# Patient Record
Sex: Female | Born: 1945 | Race: Asian | Hispanic: No | Marital: Single | Smoking: Former smoker
Health system: Southern US, Community
[De-identification: ages and names within clinical notes are randomized; demographics above are authoritative.]

## PROBLEM LIST (undated history)

## (undated) DIAGNOSIS — I10 Essential (primary) hypertension: Secondary | ICD-10-CM

## (undated) DIAGNOSIS — M1990 Unspecified osteoarthritis, unspecified site: Secondary | ICD-10-CM

## (undated) HISTORY — PX: NO PAST SURGERIES: SHX2092

---

## 2016-01-23 DIAGNOSIS — R3129 Other microscopic hematuria: Secondary | ICD-10-CM | POA: Insufficient documentation

## 2016-01-23 DIAGNOSIS — I10 Essential (primary) hypertension: Secondary | ICD-10-CM | POA: Insufficient documentation

## 2016-01-26 ENCOUNTER — Other Ambulatory Visit: Payer: Self-pay | Admitting: Family Medicine

## 2016-01-26 DIAGNOSIS — Z1231 Encounter for screening mammogram for malignant neoplasm of breast: Secondary | ICD-10-CM

## 2016-02-09 ENCOUNTER — Ambulatory Visit
Admission: RE | Admit: 2016-02-09 | Discharge: 2016-02-09 | Disposition: A | Discharge status: Home / Self Care | Payer: Medicare Other | Source: Ambulatory Visit | Attending: Family Medicine | Admitting: Family Medicine

## 2016-02-09 ENCOUNTER — Encounter: Payer: Self-pay | Admitting: Radiology

## 2016-02-09 DIAGNOSIS — Z1231 Encounter for screening mammogram for malignant neoplasm of breast: Secondary | ICD-10-CM | POA: Insufficient documentation

## 2016-02-12 ENCOUNTER — Inpatient Hospital Stay
Admission: RE | Admit: 2016-02-12 | Discharge: 2016-02-12 | Disposition: A | Discharge status: Home / Self Care | Payer: Self-pay | Source: Ambulatory Visit | Attending: *Deleted | Admitting: *Deleted

## 2016-02-12 ENCOUNTER — Other Ambulatory Visit: Payer: Self-pay | Admitting: *Deleted

## 2016-02-12 DIAGNOSIS — Z9289 Personal history of other medical treatment: Secondary | ICD-10-CM

## 2016-12-17 ENCOUNTER — Other Ambulatory Visit: Payer: Self-pay

## 2016-12-17 DIAGNOSIS — Z1211 Encounter for screening for malignant neoplasm of colon: Secondary | ICD-10-CM

## 2016-12-17 NOTE — Progress Notes (Signed)
Gastroenterology Pre-Procedure Review  Request Date: 1/7 Requesting Physician: Dr. Servando SnareWohl  PATIENT REVIEW QUESTIONS: The patient responded to the following health history questions as indicated:    1. Are you having any GI issues? no 2. Do you have a personal history of Polyps? no 3. Do you have a family history of Colon Cancer or Polyps? no 4. Diabetes Mellitus? no 5. Joint replacements in the past 12 months?no 6. Major health problems in the past 3 months?no 7. Any artificial heart valves, MVP, or defibrillator?no    MEDICATIONS & ALLERGIES:    Patient reports the following regarding taking any anticoagulation/antiplatelet therapy:   Plavix, Coumadin, Eliquis, Xarelto, Lovenox, Pradaxa, Brilinta, or Effient? no Aspirin? no  Patient confirms/reports the following medications:  Current Outpatient Medications  Medication Sig Dispense Refill  . lisinopril (PRINIVIL,ZESTRIL) 10 MG tablet Take by mouth.    Marland Kitchen. aspirin EC 81 MG tablet Take by mouth.     No current facility-administered medications for this visit.     Patient confirms/reports the following allergies:  Allergies not on file  No orders of the defined types were placed in this encounter.   AUTHORIZATION INFORMATION Primary Insurance: 1D#: Group #:  Secondary Insurance: 1D#: Group #:  SCHEDULE INFORMATION: Date: 1/7 Time: Location: [facility]

## 2016-12-30 ENCOUNTER — Encounter: Payer: Self-pay | Admitting: *Deleted

## 2016-12-30 ENCOUNTER — Other Ambulatory Visit: Payer: Self-pay

## 2017-01-07 NOTE — Discharge Instructions (Signed)
General Anesthesia, Adult, Care After °These instructions provide you with information about caring for yourself after your procedure. Your health care provider may also give you more specific instructions. Your treatment has been planned according to current medical practices, but problems sometimes occur. Call your health care provider if you have any problems or questions after your procedure. °What can I expect after the procedure? °After the procedure, it is common to have: °· Vomiting. °· A sore throat. °· Mental slowness. ° °It is common to feel: °· Nauseous. °· Cold or shivery. °· Sleepy. °· Tired. °· Sore or achy, even in parts of your body where you did not have surgery. ° °Follow these instructions at home: °For at least 24 hours after the procedure: °· Do not: °? Participate in activities where you could fall or become injured. °? Drive. °? Use heavy machinery. °? Drink alcohol. °? Take sleeping pills or medicines that cause drowsiness. °? Make important decisions or sign legal documents. °? Take care of children on your own. °· Rest. °Eating and drinking °· If you vomit, drink water, juice, or soup when you can drink without vomiting. °· Drink enough fluid to keep your urine clear or pale yellow. °· Make sure you have little or no nausea before eating solid foods. °· Follow the diet recommended by your health care provider. °General instructions °· Have a responsible adult stay with you until you are awake and alert. °· Return to your normal activities as told by your health care provider. Ask your health care provider what activities are safe for you. °· Take over-the-counter and prescription medicines only as told by your health care provider. °· If you smoke, do not smoke without supervision. °· Keep all follow-up visits as told by your health care provider. This is important. °Contact a health care provider if: °· You continue to have nausea or vomiting at home, and medicines are not helpful. °· You  cannot drink fluids or start eating again. °· You cannot urinate after 8-12 hours. °· You develop a skin rash. °· You have fever. °· You have increasing redness at the site of your procedure. °Get help right away if: °· You have difficulty breathing. °· You have chest pain. °· You have unexpected bleeding. °· You feel that you are having a life-threatening or urgent problem. °This information is not intended to replace advice given to you by your health care provider. Make sure you discuss any questions you have with your health care provider. °Document Released: 03/29/2000 Document Revised: 05/26/2015 Document Reviewed: 12/05/2014 °Elsevier Interactive Patient Education © 2018 [employer] ° °

## 2017-01-10 ENCOUNTER — Ambulatory Visit: Payer: Medicare Other | Admitting: Anesthesiology

## 2017-01-10 ENCOUNTER — Encounter
Admission: RE | Disposition: A | Discharge status: Home / Self Care | Payer: Self-pay | Source: Ambulatory Visit | Attending: Gastroenterology

## 2017-01-10 ENCOUNTER — Ambulatory Visit
Admission: RE | Admit: 2017-01-10 | Discharge: 2017-01-10 | Disposition: A | Discharge status: Home / Self Care | Payer: Medicare Other | Source: Ambulatory Visit | Attending: Gastroenterology | Admitting: Gastroenterology

## 2017-01-10 DIAGNOSIS — Z1211 Encounter for screening for malignant neoplasm of colon: Secondary | ICD-10-CM | POA: Diagnosis present

## 2017-01-10 DIAGNOSIS — M19049 Primary osteoarthritis, unspecified hand: Secondary | ICD-10-CM | POA: Insufficient documentation

## 2017-01-10 DIAGNOSIS — Z79899 Other long term (current) drug therapy: Secondary | ICD-10-CM | POA: Diagnosis not present

## 2017-01-10 DIAGNOSIS — Z7982 Long term (current) use of aspirin: Secondary | ICD-10-CM | POA: Insufficient documentation

## 2017-01-10 DIAGNOSIS — I10 Essential (primary) hypertension: Secondary | ICD-10-CM | POA: Diagnosis not present

## 2017-01-10 DIAGNOSIS — Z87891 Personal history of nicotine dependence: Secondary | ICD-10-CM | POA: Diagnosis not present

## 2017-01-10 HISTORY — PX: COLONOSCOPY WITH PROPOFOL: SHX5780

## 2017-01-10 HISTORY — DX: Unspecified osteoarthritis, unspecified site: M19.90

## 2017-01-10 HISTORY — DX: Essential (primary) hypertension: I10

## 2017-01-10 SURGERY — COLONOSCOPY WITH PROPOFOL
Anesthesia: General | Wound class: Contaminated

## 2017-01-10 MED ORDER — LACTATED RINGERS IV SOLN
INTRAVENOUS | Status: DC
  Administered 2017-01-10: 08:00:00 via INTRAVENOUS

## 2017-01-10 MED ORDER — STERILE WATER FOR IRRIGATION IR SOLN
Status: DC | PRN
  Administered 2017-01-10: 09:00:00

## 2017-01-10 MED ORDER — ACETAMINOPHEN 325 MG PO TABS: 325.0000 mg | ORAL_TABLET | Freq: Once | ORAL | Status: DC

## 2017-01-10 MED ORDER — ACETAMINOPHEN 160 MG/5ML PO SOLN: 325.0000 mg | Freq: Once | ORAL | Status: DC

## 2017-01-10 MED ORDER — LIDOCAINE HCL (CARDIAC) 20 MG/ML IV SOLN
INTRAVENOUS | Status: DC | PRN
  Administered 2017-01-10: 30 mg via INTRAVENOUS

## 2017-01-10 MED ORDER — SODIUM CHLORIDE 0.9 % IV SOLN: INTRAVENOUS | Status: DC

## 2017-01-10 MED ORDER — PROPOFOL 10 MG/ML IV BOLUS
INTRAVENOUS | Status: DC | PRN
  Administered 2017-01-10: 20 mg via INTRAVENOUS
  Administered 2017-01-10: 30 mg via INTRAVENOUS
  Administered 2017-01-10: 20 mg via INTRAVENOUS
  Administered 2017-01-10: 100 mg via INTRAVENOUS
  Administered 2017-01-10: 20 mg via INTRAVENOUS
  Administered 2017-01-10: 30 mg via INTRAVENOUS

## 2017-01-10 SURGICAL SUPPLY — 23 items

## 2017-01-10 NOTE — Transfer of Care (Signed)
Immediate Anesthesia Transfer of Care Note  Patient: Helen Jimenez  Procedure(s) Performed: COLONOSCOPY WITH PROPOFOL (N/A )  Patient Location: PACU  Anesthesia Type: General  Level of Consciousness: awake, alert  and patient cooperative  Airway and Oxygen Therapy: Patient Spontanous Breathing and Patient connected to supplemental oxygen  Post-op Assessment: Post-op Vital signs reviewed, Patient's Cardiovascular Status Stable, Respiratory Function Stable, Patent Airway and No signs of Nausea or vomiting  Post-op Vital Signs: Reviewed and stable  Complications: No apparent anesthesia complications

## 2017-01-10 NOTE — Op Note (Signed)
[facility] Gastroenterology Patient Name: Helen Jimenez Procedure Date: 01/10/2017 8:59 AM MRN: [id number] Account #: [id number] Date of Birth: [birth year] Admit Type: Outpatient Age: 72 Room: [facility] OR ROOM 01 Gender: Female Note Status: Finalized Procedure:            Colonoscopy Indications:          Screening for colorectal malignant neoplasm Providers:            Midge Minium MD, MD Referring MD:         Marylin Crosby. Greggory Stallion MD, MD (Referring MD) Medicines:            Propofol per Anesthesia Complications:        No immediate complications. Procedure:            Pre-Anesthesia Assessment:                       - Prior to the procedure, a History and Physical was                        performed, and patient medications and allergies were                        reviewed. The patient's tolerance of previous                        anesthesia was also reviewed. The risks and benefits of                        the procedure and the sedation options and risks were                        discussed with the patient. All questions were                        answered, and informed consent was obtained. Prior                        Anticoagulants: The patient has taken no previous                        anticoagulant or antiplatelet agents. ASA Grade                        Assessment: II - A patient with mild systemic disease.                        After reviewing the risks and benefits, the patient was                        deemed in satisfactory condition to undergo the                        procedure.                       After obtaining informed consent, the colonoscope was                        passed under direct vision. Throughout the procedure,  the patient's blood pressure, pulse, and oxygen                        saturations were monitored continuously. The Olympus                        Colonoscope 190 [id number]) was introduced through  the                        anus and advanced to the the cecum, identified by                        appendiceal orifice and ileocecal valve. The                        colonoscopy was performed without difficulty. The                        patient tolerated the procedure well. The quality of                        the bowel preparation was excellent. Findings:      The perianal and digital rectal examinations were normal.      The entire examined colon appeared normal. Impression:           - The entire examined colon is normal.                       - No specimens collected. Recommendation:       - Discharge patient to home.                       - Resume previous diet.                       - Continue present medications.                       - Repeat colonoscopy in 10 years for screening unless                        any change in family history or lower GI problems. Procedure Code(s):    --- Professional ---                       [phone], Colonoscopy, flexible; diagnostic, including                        collection of specimen(s) by brushing or washing, when                        performed (separate procedure) Diagnosis Code(s):    --- Professional ---                       Z12.11, Encounter for screening for malignant neoplasm                        of colon CPT copyright 2016 [employer]. All rights reserved. The codes documented in this report are preliminary and upon coder review may  be revised to meet current compliance requirements. Midge Miniumarren Azzie Thiem MD, MD  01/10/2017 9:14:06 AM This report has been signed electronically. Number of Addenda: 0 Note Initiated On: 01/10/2017 8:59 AM Scope Withdrawal Time: 0 hours 6 minutes 32 seconds  Total Procedure Duration: 0 hours 9 minutes 58 seconds       [facility]

## 2017-01-10 NOTE — H&P (Signed)
   Midge Miniumarren Kattia Selley, MD [facility] [address] [place], [place] [address] Phone: [phone] Fax : [phone]  Primary Care Physician:  Rayetta HumphreyGeorge, Sionne A, MD Primary Gastroenterologist:  Dr. Servando SnareWohl  Pre-Procedure History & Physical: HPI:  Helen Jimenez is a 72 y.o. female is here for a screening colonoscopy.   Past Medical History:  Diagnosis Date  . Arthritis    fingers  . Hypertension     Past Surgical History:  Procedure Laterality Date  . NO PAST SURGERIES      Prior to Admission medications   Medication Sig Start Date End Date Taking? Authorizing Provider  Ascorbic Acid (VITAMIN C PO) Take by mouth daily.   Yes [provider]  aspirin EC 81 MG tablet Take by mouth.   Yes [provider]  Cholecalciferol (VITAMIN D PO) Take by mouth daily.   Yes [provider]  Cyanocobalamin (VITAMIN B-12 PO) Take by mouth daily.   Yes [provider]  lisinopril (PRINIVIL,ZESTRIL) 10 MG tablet Take by mouth. 01/23/16  Yes [provider]  VITAMIN E PO Take by mouth daily.   Yes [provider]    Allergies as of 12/17/2016  . (Not on File)    Family History  Problem Relation Age of Onset  . Breast cancer Neg Hx     Social History   Socioeconomic History  . Marital status: Single    Spouse name: Not on file  . Number of children: Not on file  . Years of education: Not on file  . Highest education level: Not on file  Social Needs  . Financial resource strain: Not on file  . Food insecurity - worry: Not on file  . Food insecurity - inability: Not on file  . Transportation needs - medical: Not on file  . Transportation needs - non-medical: Not on file  Occupational History  . Not on file  Tobacco Use  . Smoking status: Former Smoker    Types: Cigarettes    Last attempt to quit: 2013    Years since quitting: 6.0  . Smokeless tobacco: Never Used  Substance and Sexual Activity  . Alcohol use: No    Frequency:  Never  . Drug use: Not on file  . Sexual activity: Not on file  Other Topics Concern  . Not on file  Social History Narrative  . Not on file    Review of Systems: See HPI, otherwise negative ROS  Physical Exam: BP (!) 148/94   Pulse 88   Temp 98.1 F (36.7 C) (Temporal)   Ht 5' (1.524 m)   Wt 118 lb (53.5 kg)   SpO2 99%   BMI 23.05 kg/m  General:   Alert,  pleasant and cooperative in NAD Head:  Normocephalic and atraumatic. Neck:  Supple; no masses or thyromegaly. Lungs:  Clear throughout to auscultation.    Heart:  Regular rate and rhythm. Abdomen:  Soft, nontender and nondistended. Normal bowel sounds, without guarding, and without rebound.   Neurologic:  Alert and  oriented x4;  grossly normal neurologically.  Impression/Plan: Helen Jimenez is now here to undergo a screening colonoscopy.  Risks, benefits, and alternatives regarding colonoscopy have been reviewed with the patient.  Questions have been answered.  All parties agreeable.

## 2017-01-10 NOTE — Anesthesia Postprocedure Evaluation (Signed)
Anesthesia Post Note  Patient: Helen Jimenez  Procedure(s) Performed: COLONOSCOPY WITH PROPOFOL (N/A )  Patient location during evaluation: PACU Anesthesia Type: General Level of consciousness: awake and alert and oriented Pain management: satisfactory to patient Vital Signs Assessment: post-procedure vital signs reviewed and stable Respiratory status: spontaneous breathing, nonlabored ventilation and respiratory function stable Cardiovascular status: blood pressure returned to baseline and stable Postop Assessment: Adequate PO intake and No signs of nausea or vomiting Anesthetic complications: no    Cherly BeachStella, Ossie Beltran J

## 2017-01-10 NOTE — Anesthesia Preprocedure Evaluation (Signed)
Anesthesia Evaluation  Patient identified by MRN, date of birth, ID band Patient awake    Reviewed: Allergy & Precautions, H&P , NPO status , Patient's Chart, lab work & pertinent test results  Airway Mallampati: III  TM Distance: >3 FB Neck ROM: full    Dental no notable dental hx.    Pulmonary former smoker,    Pulmonary exam normal breath sounds clear to auscultation       Cardiovascular hypertension, Normal cardiovascular exam Rhythm:regular Rate:Normal     Neuro/Psych    GI/Hepatic   Endo/Other    Renal/GU      Musculoskeletal   Abdominal   Peds  Hematology   Anesthesia Other Findings   Reproductive/Obstetrics                             Anesthesia Physical Anesthesia Plan  ASA: II  Anesthesia Plan: General   Post-op Pain Management:    Induction: Intravenous  PONV Risk Score and Plan: 3 and Treatment may vary due to age or medical condition and Propofol infusion  Airway Management Planned: Natural Airway  Additional Equipment:   Intra-op Plan:   Post-operative Plan:   Informed Consent: I have reviewed the patients History and Physical, chart, labs and discussed the procedure including the risks, benefits and alternatives for the proposed anesthesia with the patient or authorized representative who has indicated his/her understanding and acceptance.     Plan Discussed with: CRNA  Anesthesia Plan Comments:         Anesthesia Quick Evaluation

## 2017-01-10 NOTE — Anesthesia Procedure Notes (Signed)
Date/Time: 01/10/2017 8:56 AM Performed by: Maree KrabbeWarren, Wilmer Berryhill, CRNA Pre-anesthesia Checklist: Patient identified, Emergency Drugs available, Suction available, Timeout performed and Patient being monitored Patient Re-evaluated:Patient Re-evaluated prior to induction Oxygen Delivery Method: Nasal cannula Placement Confirmation: positive ETCO2

## 2017-01-12 ENCOUNTER — Other Ambulatory Visit: Payer: Self-pay | Admitting: Family Medicine

## 2017-01-12 DIAGNOSIS — Z1231 Encounter for screening mammogram for malignant neoplasm of breast: Secondary | ICD-10-CM

## 2017-01-14 ENCOUNTER — Other Ambulatory Visit: Payer: Self-pay | Admitting: Family Medicine

## 2017-01-14 DIAGNOSIS — N838 Other noninflammatory disorders of ovary, fallopian tube and broad ligament: Secondary | ICD-10-CM

## 2017-01-25 ENCOUNTER — Ambulatory Visit
Admission: RE | Admit: 2017-01-25 | Discharge: 2017-01-25 | Disposition: A | Discharge status: Home / Self Care | Payer: Medicare Other | Source: Ambulatory Visit | Attending: Family Medicine | Admitting: Family Medicine

## 2017-01-25 DIAGNOSIS — N838 Other noninflammatory disorders of ovary, fallopian tube and broad ligament: Secondary | ICD-10-CM | POA: Diagnosis present

## 2017-01-25 DIAGNOSIS — I10 Essential (primary) hypertension: Secondary | ICD-10-CM | POA: Insufficient documentation

## 2017-02-09 ENCOUNTER — Ambulatory Visit
Admission: RE | Admit: 2017-02-09 | Discharge: 2017-02-09 | Disposition: A | Discharge status: Home / Self Care | Payer: Medicare Other | Source: Ambulatory Visit | Attending: Family Medicine | Admitting: Family Medicine

## 2017-02-09 DIAGNOSIS — Z1231 Encounter for screening mammogram for malignant neoplasm of breast: Secondary | ICD-10-CM | POA: Insufficient documentation

## 2018-01-06 ENCOUNTER — Other Ambulatory Visit: Payer: Self-pay | Admitting: Family Medicine

## 2018-01-06 DIAGNOSIS — Z1231 Encounter for screening mammogram for malignant neoplasm of breast: Secondary | ICD-10-CM

## 2018-01-24 ENCOUNTER — Encounter (INDEPENDENT_AMBULATORY_CARE_PROVIDER_SITE_OTHER): Payer: Self-pay

## 2018-01-24 ENCOUNTER — Ambulatory Visit
Admission: RE | Admit: 2018-01-24 | Discharge: 2018-01-24 | Disposition: A | Discharge status: Home / Self Care | Payer: Medicare Other | Source: Ambulatory Visit | Attending: Family Medicine | Admitting: Family Medicine

## 2018-01-24 DIAGNOSIS — Z1231 Encounter for screening mammogram for malignant neoplasm of breast: Secondary | ICD-10-CM

## 2018-02-14 ENCOUNTER — Ambulatory Visit
Admission: RE | Admit: 2018-02-14 | Discharge: 2018-02-14 | Disposition: A | Discharge status: Home / Self Care | Payer: Medicare Other | Source: Ambulatory Visit | Attending: Family Medicine | Admitting: Family Medicine

## 2018-02-14 DIAGNOSIS — Z1231 Encounter for screening mammogram for malignant neoplasm of breast: Secondary | ICD-10-CM | POA: Diagnosis present

## 2019-01-17 ENCOUNTER — Other Ambulatory Visit: Payer: Self-pay | Admitting: Family Medicine

## 2019-01-17 DIAGNOSIS — Z1231 Encounter for screening mammogram for malignant neoplasm of breast: Secondary | ICD-10-CM

## 2019-02-19 ENCOUNTER — Ambulatory Visit
Admission: RE | Admit: 2019-02-19 | Discharge: 2019-02-19 | Disposition: A | Discharge status: Home / Self Care | Payer: Medicare Other | Source: Ambulatory Visit | Attending: Family Medicine | Admitting: Family Medicine

## 2019-02-19 ENCOUNTER — Other Ambulatory Visit: Payer: Self-pay

## 2019-02-19 DIAGNOSIS — Z1231 Encounter for screening mammogram for malignant neoplasm of breast: Secondary | ICD-10-CM | POA: Insufficient documentation

## 2019-07-24 ENCOUNTER — Other Ambulatory Visit: Payer: Self-pay | Admitting: Family Medicine

## 2019-07-24 DIAGNOSIS — Z780 Asymptomatic menopausal state: Secondary | ICD-10-CM

## 2019-08-14 ENCOUNTER — Other Ambulatory Visit: Payer: Self-pay

## 2019-08-14 ENCOUNTER — Ambulatory Visit
Admission: RE | Admit: 2019-08-14 | Discharge: 2019-08-14 | Disposition: A | Discharge status: Home / Self Care | Payer: Medicare Other | Source: Ambulatory Visit | Attending: Family Medicine | Admitting: Family Medicine

## 2019-08-14 ENCOUNTER — Encounter (INDEPENDENT_AMBULATORY_CARE_PROVIDER_SITE_OTHER): Payer: Self-pay

## 2019-08-14 DIAGNOSIS — Z780 Asymptomatic menopausal state: Secondary | ICD-10-CM | POA: Insufficient documentation

## 2020-01-22 ENCOUNTER — Other Ambulatory Visit: Payer: Self-pay | Admitting: Family Medicine

## 2020-01-22 DIAGNOSIS — Z1231 Encounter for screening mammogram for malignant neoplasm of breast: Secondary | ICD-10-CM

## 2020-02-20 ENCOUNTER — Ambulatory Visit
Admission: RE | Admit: 2020-02-20 | Discharge: 2020-02-20 | Disposition: A | Discharge status: Home / Self Care | Payer: Medicare Other | Source: Ambulatory Visit | Attending: Family Medicine | Admitting: Family Medicine

## 2020-02-20 ENCOUNTER — Other Ambulatory Visit: Payer: Self-pay

## 2020-02-20 DIAGNOSIS — Z1231 Encounter for screening mammogram for malignant neoplasm of breast: Secondary | ICD-10-CM | POA: Diagnosis not present

## 2020-12-24 IMAGING — MG DIGITAL SCREENING BILAT W/ TOMO W/ CAD
8 series · 8 of 24 positions shown · non-contrast
Comparison: Previous exam(s).

CLINICAL DATA: Screening.

EXAM:
DIGITAL SCREENING BILATERAL MAMMOGRAM WITH TOMO AND CAD

[L MLO synth-2D]
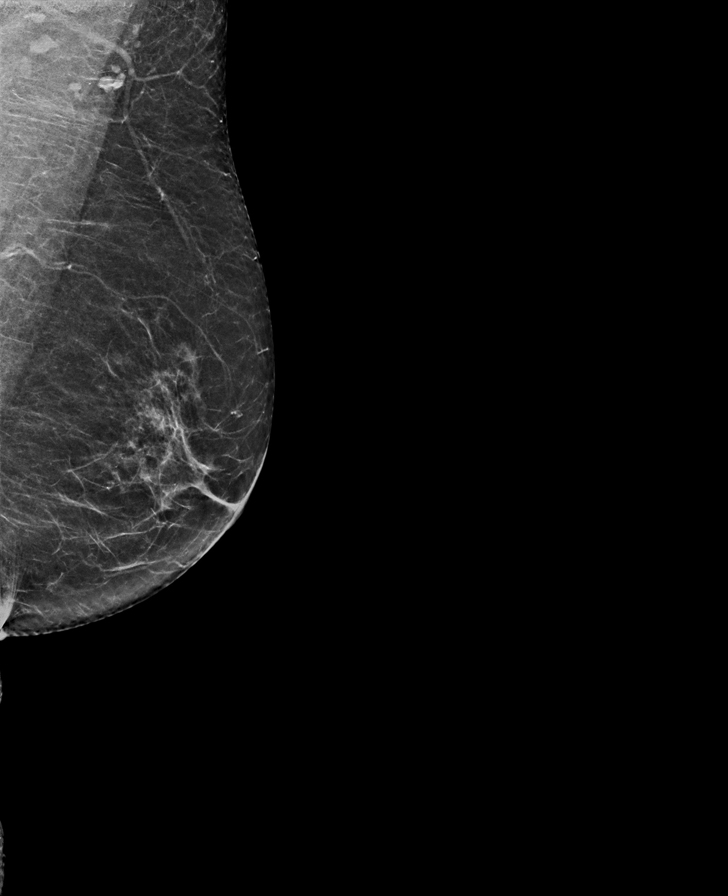

[R CC synth-2D]
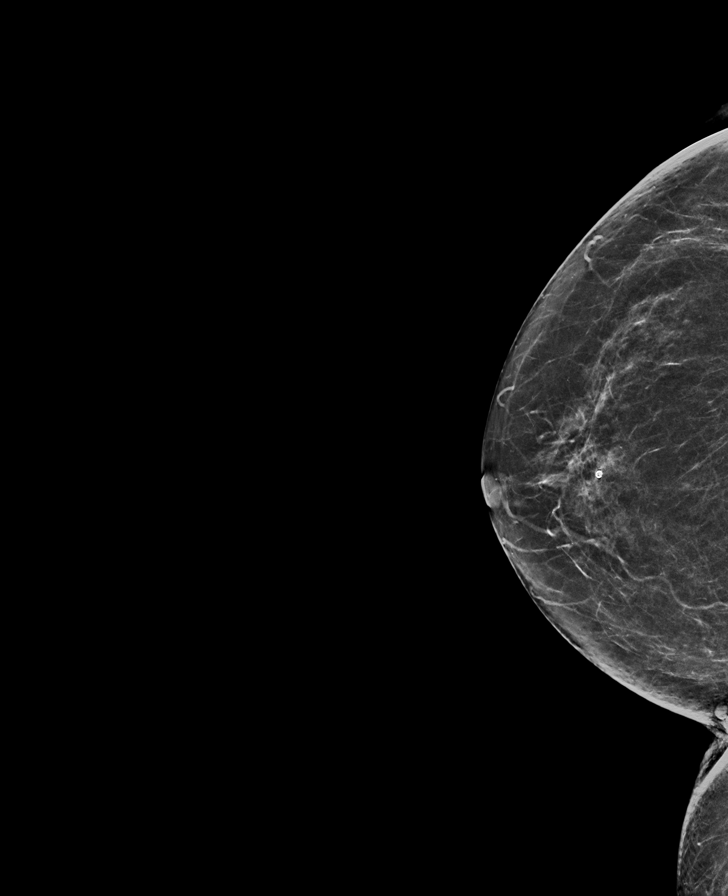

[R MLO synth-2D]
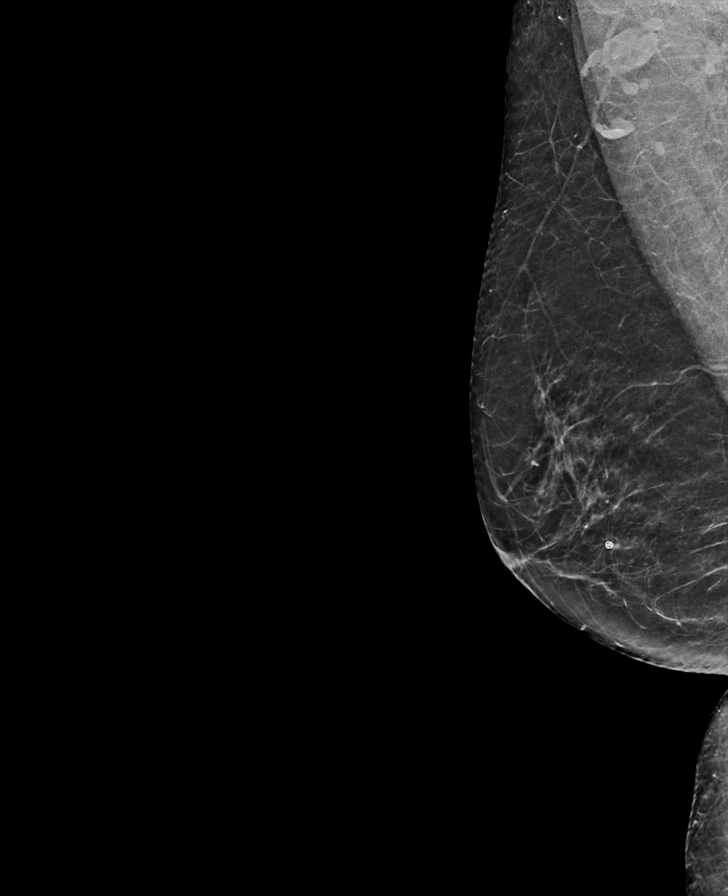

[L CC synth-2D]
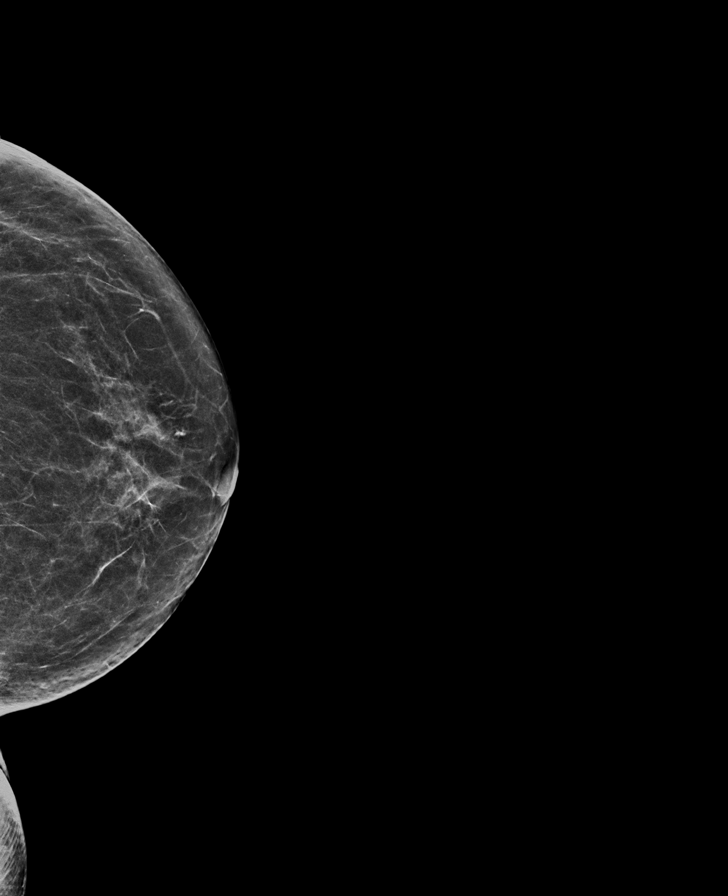

[L MLO tomo · tomo slice 30/59.0]
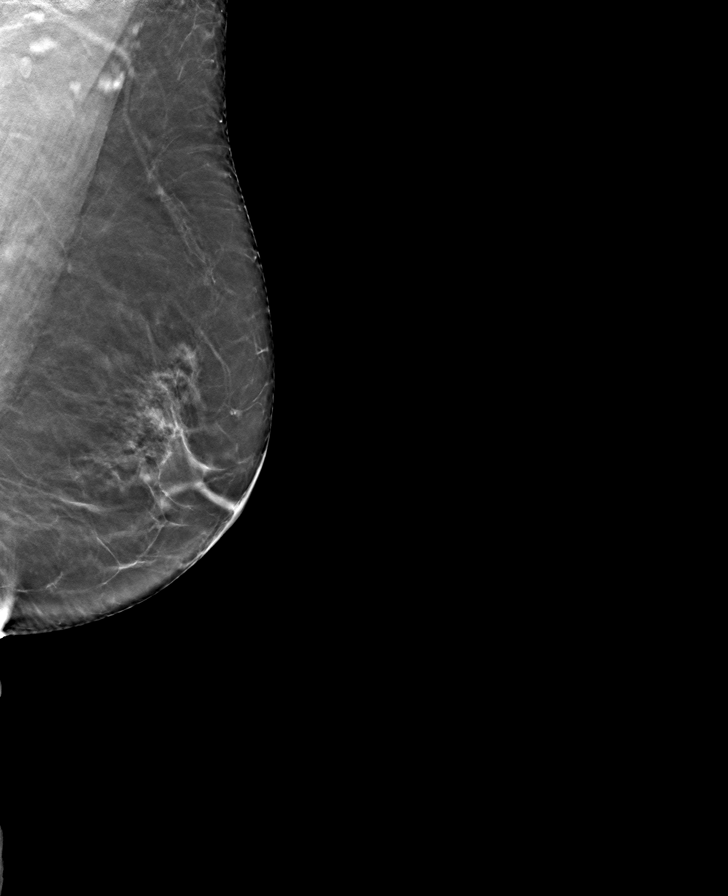

[L CC tomo · tomo slice 30/59.0]
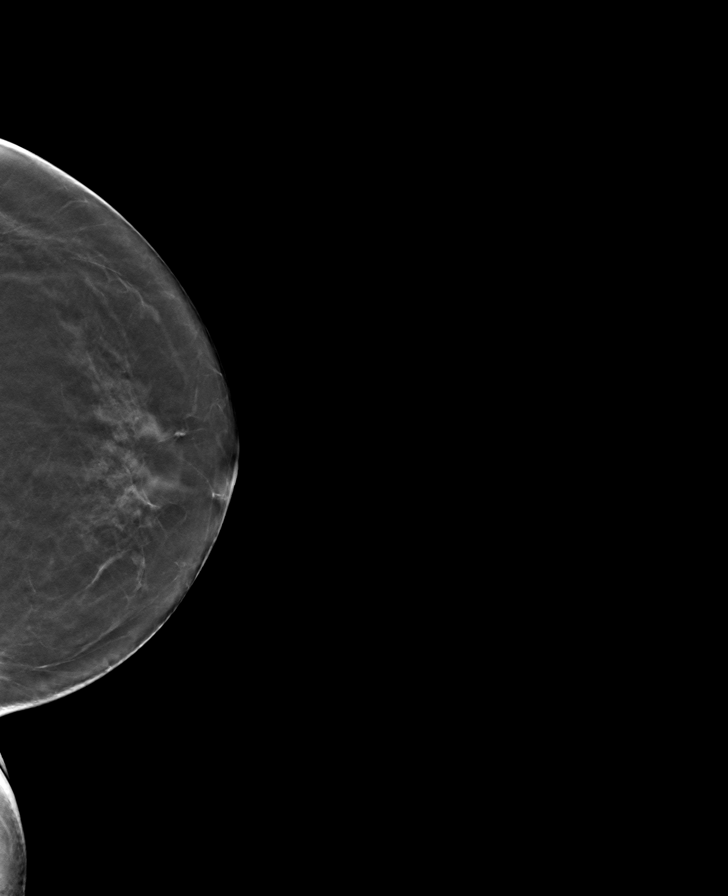

[R MLO tomo · tomo slice 29/58.0]
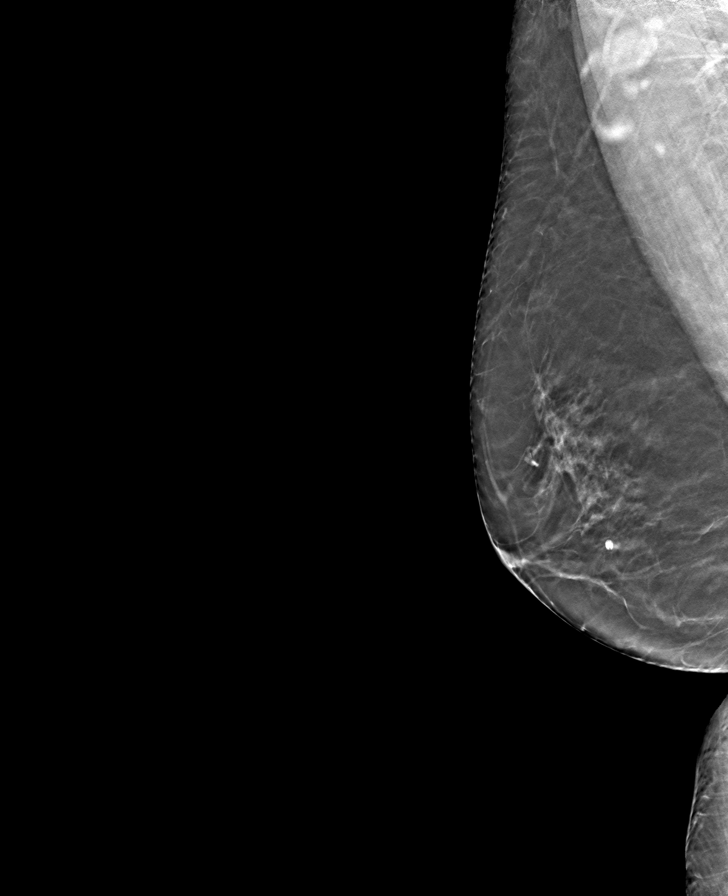

[R CC tomo · tomo slice 31/60.0]
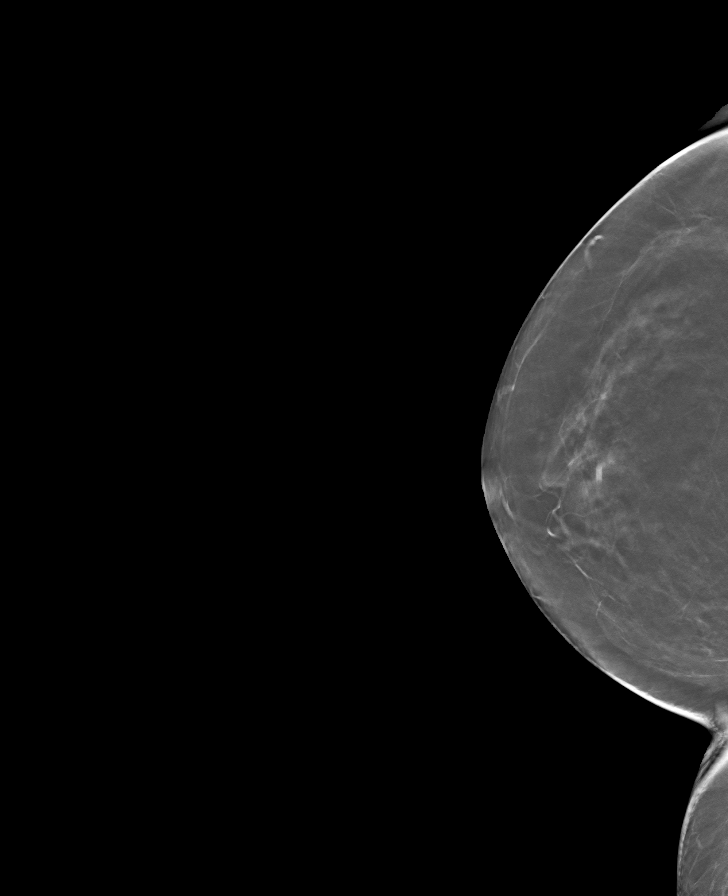

[8 of 24 positions shown; findings below may reference images not displayed]

ACR Breast Density Category b: There are scattered areas of
fibroglandular density.
FINDINGS: There are no findings suspicious for malignancy. Images were
processed with CAD.
IMPRESSION: No mammographic evidence of malignancy. A result letter of this
screening mammogram will be mailed directly to the patient.

RECOMMENDATION:
Screening mammogram in one year. (Code:[id number])

BI-RADS CATEGORY  1: Negative.

## 2021-01-15 ENCOUNTER — Other Ambulatory Visit: Payer: Self-pay | Admitting: Family Medicine

## 2021-01-15 DIAGNOSIS — Z1231 Encounter for screening mammogram for malignant neoplasm of breast: Secondary | ICD-10-CM

## 2021-02-24 ENCOUNTER — Ambulatory Visit
Admission: RE | Admit: 2021-02-24 | Discharge: 2021-02-24 | Disposition: A | Discharge status: Home / Self Care | Payer: Medicare Other | Source: Ambulatory Visit | Attending: Family Medicine | Admitting: Family Medicine

## 2021-02-24 ENCOUNTER — Other Ambulatory Visit: Payer: Self-pay

## 2021-02-24 DIAGNOSIS — Z1231 Encounter for screening mammogram for malignant neoplasm of breast: Secondary | ICD-10-CM | POA: Diagnosis not present

## 2021-11-30 ENCOUNTER — Other Ambulatory Visit: Payer: Self-pay | Admitting: Internal Medicine

## 2021-11-30 ENCOUNTER — Ambulatory Visit
Admission: RE | Admit: 2021-11-30 | Discharge: 2021-11-30 | Disposition: A | Discharge status: Home / Self Care | Payer: Medicare Other | Source: Ambulatory Visit | Attending: Internal Medicine | Admitting: Internal Medicine

## 2021-11-30 ENCOUNTER — Ambulatory Visit
Admission: RE | Admit: 2021-11-30 | Discharge: 2021-11-30 | Disposition: A | Discharge status: Home / Self Care | Payer: Medicare Other | Attending: Internal Medicine | Admitting: Internal Medicine

## 2021-11-30 DIAGNOSIS — M79672 Pain in left foot: Secondary | ICD-10-CM

## 2022-01-22 ENCOUNTER — Other Ambulatory Visit: Payer: Self-pay

## 2022-01-22 DIAGNOSIS — Z1231 Encounter for screening mammogram for malignant neoplasm of breast: Secondary | ICD-10-CM

## 2022-01-29 ENCOUNTER — Other Ambulatory Visit: Payer: Self-pay | Admitting: Family Medicine

## 2022-01-29 DIAGNOSIS — Z780 Asymptomatic menopausal state: Secondary | ICD-10-CM

## 2022-02-25 ENCOUNTER — Ambulatory Visit
Admission: RE | Admit: 2022-02-25 | Discharge: 2022-02-25 | Disposition: A | Discharge status: Home / Self Care | Payer: 59 | Source: Ambulatory Visit | Attending: Family Medicine | Admitting: Family Medicine

## 2022-02-25 ENCOUNTER — Ambulatory Visit: Payer: Medicare Other

## 2022-02-25 DIAGNOSIS — Z780 Asymptomatic menopausal state: Secondary | ICD-10-CM

## 2022-02-25 DIAGNOSIS — Z1231 Encounter for screening mammogram for malignant neoplasm of breast: Secondary | ICD-10-CM | POA: Diagnosis present

## 2022-04-12 ENCOUNTER — Other Ambulatory Visit: Payer: Self-pay | Admitting: Family Medicine

## 2022-04-12 DIAGNOSIS — R7989 Other specified abnormal findings of blood chemistry: Secondary | ICD-10-CM

## 2022-04-27 ENCOUNTER — Ambulatory Visit
Admission: RE | Admit: 2022-04-27 | Discharge: 2022-04-27 | Disposition: A | Discharge status: Home / Self Care | Payer: 59 | Source: Ambulatory Visit | Attending: Family Medicine | Admitting: Family Medicine

## 2022-04-27 DIAGNOSIS — R7989 Other specified abnormal findings of blood chemistry: Secondary | ICD-10-CM | POA: Diagnosis not present

## 2022-05-03 ENCOUNTER — Other Ambulatory Visit: Payer: 59

## 2023-01-27 ENCOUNTER — Other Ambulatory Visit: Payer: Self-pay | Admitting: Family Medicine

## 2023-01-27 DIAGNOSIS — Z1231 Encounter for screening mammogram for malignant neoplasm of breast: Secondary | ICD-10-CM

## 2023-02-28 ENCOUNTER — Ambulatory Visit
Admission: RE | Admit: 2023-02-28 | Discharge: 2023-02-28 | Disposition: A | Discharge status: Home / Self Care | Payer: 59 | Source: Ambulatory Visit | Attending: Family Medicine | Admitting: Family Medicine

## 2023-02-28 DIAGNOSIS — Z1231 Encounter for screening mammogram for malignant neoplasm of breast: Secondary | ICD-10-CM | POA: Insufficient documentation

## 2023-07-28 ENCOUNTER — Other Ambulatory Visit: Payer: Self-pay | Admitting: Family Medicine

## 2023-07-28 DIAGNOSIS — R7989 Other specified abnormal findings of blood chemistry: Secondary | ICD-10-CM

## 2023-07-28 DIAGNOSIS — I10 Essential (primary) hypertension: Secondary | ICD-10-CM

## 2023-08-04 ENCOUNTER — Ambulatory Visit
Admission: RE | Admit: 2023-08-04 | Discharge: 2023-08-04 | Disposition: A | Discharge status: Home / Self Care | Source: Ambulatory Visit | Attending: Family Medicine | Admitting: Family Medicine

## 2023-08-04 DIAGNOSIS — I10 Essential (primary) hypertension: Secondary | ICD-10-CM | POA: Insufficient documentation

## 2023-08-04 DIAGNOSIS — R7989 Other specified abnormal findings of blood chemistry: Secondary | ICD-10-CM | POA: Diagnosis present

## 2023-08-17 ENCOUNTER — Other Ambulatory Visit: Payer: Self-pay | Admitting: Family Medicine

## 2023-08-17 DIAGNOSIS — R7989 Other specified abnormal findings of blood chemistry: Secondary | ICD-10-CM

## 2023-08-17 DIAGNOSIS — R9389 Abnormal findings on diagnostic imaging of other specified body structures: Secondary | ICD-10-CM

## 2023-08-23 ENCOUNTER — Inpatient Hospital Stay
Admission: RE | Admit: 2023-08-23 | Discharge: 2023-08-23 | Discharge status: Home / Self Care | Source: Ambulatory Visit | Attending: Family Medicine | Admitting: Family Medicine

## 2023-08-23 DIAGNOSIS — R7989 Other specified abnormal findings of blood chemistry: Secondary | ICD-10-CM

## 2023-08-23 DIAGNOSIS — R9389 Abnormal findings on diagnostic imaging of other specified body structures: Secondary | ICD-10-CM

## 2023-08-23 MED ORDER — GADOPICLENOL 0.5 MMOL/ML IV SOLN
7.0000 mL | Freq: Once | INTRAVENOUS | Status: AC | PRN
  Administered 2023-08-23: 7 mL via INTRAVENOUS

## 2023-08-23 MED ORDER — GADOPICLENOL 0.5 MMOL/ML IV SOLN: 6.0000 mL | Freq: Once | INTRAVENOUS | Status: DC | PRN

## 2024-01-31 ENCOUNTER — Other Ambulatory Visit: Payer: Self-pay | Admitting: Family Medicine

## 2024-01-31 DIAGNOSIS — Z1231 Encounter for screening mammogram for malignant neoplasm of breast: Secondary | ICD-10-CM

## 2024-02-29 ENCOUNTER — Ambulatory Visit
